# Patient Record
Sex: Female | Born: 1975 | Race: Black or African American | Hispanic: No | Marital: Single | Smoking: Current every day smoker
Health system: Southern US, Community
[De-identification: ages and names within clinical notes are randomized; demographics above are authoritative.]

## PROBLEM LIST (undated history)

## (undated) DIAGNOSIS — J45909 Unspecified asthma, uncomplicated: Secondary | ICD-10-CM

## (undated) DIAGNOSIS — S0500XA Injury of conjunctiva and corneal abrasion without foreign body, unspecified eye, initial encounter: Secondary | ICD-10-CM

## (undated) HISTORY — PX: ABDOMINAL HYSTERECTOMY: SHX81

---

## 2019-12-21 ENCOUNTER — Other Ambulatory Visit: Payer: Self-pay

## 2019-12-21 ENCOUNTER — Emergency Department (HOSPITAL_COMMUNITY)
Admission: EM | Admit: 2019-12-21 | Discharge: 2019-12-22 | Disposition: A | Discharge status: Home / Self Care | Payer: Self-pay | Attending: Emergency Medicine | Admitting: Emergency Medicine

## 2019-12-21 ENCOUNTER — Emergency Department (HOSPITAL_COMMUNITY): Payer: Self-pay

## 2019-12-21 ENCOUNTER — Encounter (HOSPITAL_COMMUNITY): Payer: Self-pay

## 2019-12-21 DIAGNOSIS — S20212A Contusion of left front wall of thorax, initial encounter: Secondary | ICD-10-CM

## 2019-12-21 DIAGNOSIS — Y999 Unspecified external cause status: Secondary | ICD-10-CM | POA: Insufficient documentation

## 2019-12-21 DIAGNOSIS — Y939 Activity, unspecified: Secondary | ICD-10-CM | POA: Insufficient documentation

## 2019-12-21 DIAGNOSIS — S0012XA Contusion of left eyelid and periocular area, initial encounter: Secondary | ICD-10-CM

## 2019-12-21 DIAGNOSIS — Y929 Unspecified place or not applicable: Secondary | ICD-10-CM | POA: Insufficient documentation
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
## 2019-12-22 ENCOUNTER — Emergency Department (HOSPITAL_COMMUNITY): Payer: Self-pay

## 2019-12-22 MED ORDER — IBUPROFEN 800 MG PO TABS: 800.0000 mg | ORAL_TABLET | Freq: Three times a day (TID) | ORAL | 0 refills | Status: AC | PRN

## 2019-12-22 NOTE — Discharge Instructions (Signed)
Ibuprofen 600 mg every 6 hours as needed for pain.  Rest.  Follow-up with primary doctor if not improving in the next few days.

## 2020-05-16 ENCOUNTER — Encounter (HOSPITAL_COMMUNITY): Payer: Self-pay | Admitting: Physician Assistant

## 2020-05-16 ENCOUNTER — Other Ambulatory Visit: Payer: Self-pay

## 2020-05-16 ENCOUNTER — Emergency Department (HOSPITAL_COMMUNITY)
Admission: EM | Admit: 2020-05-16 | Discharge: 2020-05-16 | Disposition: A | Discharge status: Home / Self Care | Payer: Self-pay | Attending: Emergency Medicine | Admitting: Emergency Medicine

## 2020-05-16 DIAGNOSIS — R519 Headache, unspecified: Secondary | ICD-10-CM | POA: Insufficient documentation

## 2020-05-16 DIAGNOSIS — Z20822 Contact with and (suspected) exposure to covid-19: Secondary | ICD-10-CM | POA: Insufficient documentation

## 2020-05-16 DIAGNOSIS — R5383 Other fatigue: Secondary | ICD-10-CM | POA: Insufficient documentation

## 2020-05-16 DIAGNOSIS — M546 Pain in thoracic spine: Secondary | ICD-10-CM | POA: Insufficient documentation

## 2020-05-16 DIAGNOSIS — M791 Myalgia, unspecified site: Secondary | ICD-10-CM | POA: Insufficient documentation

## 2020-05-16 LAB — SARS CORONAVIRUS 2 BY RT PCR (HOSPITAL ORDER, PERFORMED IN ~~LOC~~ HOSPITAL LAB): SARS Coronavirus 2: NEGATIVE

## 2020-05-16 MED ORDER — BENZONATATE 100 MG PO CAPS: 100.0000 mg | ORAL_CAPSULE | Freq: Three times a day (TID) | ORAL | 0 refills | Status: AC
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
## 2020-05-16 NOTE — Discharge Instructions (Addendum)
Rotate tylenol and motrin for fevers and body aches.  Take the cough medication as directed.  Make sure you are pushing fluids and eating and drinking. ° °You should be isolated for at least 7 days since the onset of your symptoms AND >72 hours after symptoms resolution (absence of fever without the use of fever reducing medicaiton and improvement in respiratory symptoms), whichever is longer ° °Please follow up with your primary care provider within 5-7 days for re-evaluation of your symptoms. If you do not have a primary care provider, information for a healthcare clinic has been provided for you to make arrangements for follow up care. Please return to the emergency department for any new or worsening symptoms. ° °

## 2020-05-16 NOTE — ED Triage Notes (Signed)
Patient reports headache and back ache starting last night. Pain rated 3/10 - described as a dull pain

## 2020-06-23 ENCOUNTER — Ambulatory Visit: Payer: Self-pay | Attending: Family | Admitting: Family

## 2020-06-23 ENCOUNTER — Other Ambulatory Visit: Payer: Self-pay

## 2020-06-23 DIAGNOSIS — Z09 Encounter for follow-up examination after completed treatment for conditions other than malignant neoplasm: Secondary | ICD-10-CM

## 2020-06-23 NOTE — Patient Instructions (Signed)
Patient was given clear instructions to go to Emergency Department or return to medical center if symptoms don't improve, worsen, or new problems develop.The patient verbalized understanding. Follow-up in 3 months or sooner if needed to establish care with primary physician.  COVID-19 COVID-19 is a respiratory infection that is caused by a virus called severe acute respiratory syndrome coronavirus 2 (SARS-CoV-2). The disease is also known as coronavirus disease or novel coronavirus. In some people, the virus may not cause any symptoms. In others, it may cause a serious infection. The infection can get worse quickly and can lead to complications, such as:  Pneumonia, or infection of the lungs.  Acute respiratory distress syndrome or ARDS. This is a condition in which fluid build-up in the lungs prevents the lungs from filling with air and passing oxygen into the blood.  Acute respiratory failure. This is a condition in which there is not enough oxygen passing from the lungs to the body or when carbon dioxide is not passing from the lungs out of the body.  Sepsis or septic shock. This is a serious bodily reaction to an infection.  Blood clotting problems.  Secondary infections due to bacteria or fungus.  Organ failure. This is when your body's organs stop working. The virus that causes COVID-19 is contagious. This means that it can spread from person to person through droplets from coughs and sneezes (respiratory secretions). What are the causes? This illness is caused by a virus. You may catch the virus by:  Breathing in droplets from an infected person. Droplets can be spread by a person breathing, speaking, singing, coughing, or sneezing.  Touching something, like a table or a doorknob, that was exposed to the virus (contaminated) and then touching your mouth, nose, or eyes. What increases the risk? Risk for infection You are more likely to be infected with this virus if you:  Are  within 6 feet (2 meters) of a person with COVID-19.  Provide care for or live with a person who is infected with COVID-19.  Spend time in crowded indoor spaces or live in shared housing. Risk for serious illness You are more likely to become seriously ill from the virus if you:  Are 46 years of age or older. The higher your age, the more you are at risk for serious illness.  Live in a nursing home or long-term care facility.  Have cancer.  Have a long-term (chronic) disease such as: ? Chronic lung disease, including chronic obstructive pulmonary disease or asthma. ? A long-term disease that lowers your body's ability to fight infection (immunocompromised). ? Heart disease, including heart failure, a condition in which the arteries that lead to the heart become narrow or blocked (coronary artery disease), a disease which makes the heart muscle thick, weak, or stiff (cardiomyopathy). ? Diabetes. ? Chronic kidney disease. ? Sickle cell disease, a condition in which red blood cells have an abnormal "sickle" shape. ? Liver disease.  Are obese. What are the signs or symptoms? Symptoms of this condition can range from mild to severe. Symptoms may appear any time from 2 to 14 days after being exposed to the virus. They include:  A fever or chills.  A cough.  Difficulty breathing.  Headaches, body aches, or muscle aches.  Runny or stuffy (congested) nose.  A sore throat.  New loss of taste or smell. Some people may also have stomach problems, such as nausea, vomiting, or diarrhea. Other people may not have any symptoms of COVID-19. How is  this diagnosed? This condition may be diagnosed based on:  Your signs and symptoms, especially if: ? You live in an area with a COVID-19 outbreak. ? You recently traveled to or from an area where the virus is common. ? You provide care for or live with a person who was diagnosed with COVID-19. ? You were exposed to a person who was diagnosed  with COVID-19.  A physical exam.  Lab tests, which may include: ? Taking a sample of fluid from the back of your nose and throat (nasopharyngeal fluid), your nose, or your throat using a swab. ? A sample of mucus from your lungs (sputum). ? Blood tests.  Imaging tests, which may include, X-rays, CT scan, or ultrasound. How is this treated? At present, there is no medicine to treat COVID-19. Medicines that treat other diseases are being used on a trial basis to see if they are effective against COVID-19. Your health care provider will talk with you about ways to treat your symptoms. For most people, the infection is mild and can be managed at home with rest, fluids, and over-the-counter medicines. Treatment for a serious infection usually takes places in a hospital intensive care unit (ICU). It may include one or more of the following treatments. These treatments are given until your symptoms improve.  Receiving fluids and medicines through an IV.  Supplemental oxygen. Extra oxygen is given through a tube in the nose, a face mask, or a hood.  Positioning you to lie on your stomach (prone position). This makes it easier for oxygen to get into the lungs.  Continuous positive airway pressure (CPAP) or bi-level positive airway pressure (BPAP) machine. This treatment uses mild air pressure to keep the airways open. A tube that is connected to a motor delivers oxygen to the body.  Ventilator. This treatment moves air into and out of the lungs by using a tube that is placed in your windpipe.  Tracheostomy. This is a procedure to create a hole in the neck so that a breathing tube can be inserted.  Extracorporeal membrane oxygenation (ECMO). This procedure gives the lungs a chance to recover by taking over the functions of the heart and lungs. It supplies oxygen to the body and removes carbon dioxide. Follow these instructions at home: Lifestyle  If you are sick, stay home except to get medical  care. Your health care provider will tell you how long to stay home. Call your health care provider before you go for medical care.  Rest at home as told by your health care provider.  Do not use any products that contain nicotine or tobacco, such as cigarettes, e-cigarettes, and chewing tobacco. If you need help quitting, ask your health care provider.  Return to your normal activities as told by your health care provider. Ask your health care provider what activities are safe for you. General instructions  Take over-the-counter and prescription medicines only as told by your health care provider.  Drink enough fluid to keep your urine pale yellow.  Keep all follow-up visits as told by your health care provider. This is important. How is this prevented?  There is no vaccine to help prevent COVID-19 infection. However, there are steps you can take to protect yourself and others from this virus. To protect yourself:   Do not travel to areas where COVID-19 is a risk. The areas where COVID-19 is reported change often. To identify high-risk areas and travel restrictions, check the CDC travel website: StageSync.si  If you  live in, or must travel to, an area where COVID-19 is a risk, take precautions to avoid infection. ? Stay away from people who are sick. ? Wash your hands often with soap and water for 20 seconds. If soap and water are not available, use an alcohol-based hand sanitizer. ? Avoid touching your mouth, face, eyes, or nose. ? Avoid going out in public, follow guidance from your state and local health authorities. ? If you must go out in public, wear a cloth face covering or face mask. Make sure your mask covers your nose and mouth. ? Avoid crowded indoor spaces. Stay at least 6 feet (2 meters) away from others. ? Disinfect objects and surfaces that are frequently touched every day. This may include:  Counters and tables.  Doorknobs and light switches.  Sinks  and faucets.  Electronics, such as phones, remote controls, keyboards, computers, and tablets. To protect others: If you have symptoms of COVID-19, take steps to prevent the virus from spreading to others.  If you think you have a COVID-19 infection, contact your health care provider right away. Tell your health care team that you think you may have a COVID-19 infection.  Stay home. Leave your house only to seek medical care. Do not use public transport.  Do not travel while you are sick.  Wash your hands often with soap and water for 20 seconds. If soap and water are not available, use alcohol-based hand sanitizer.  Stay away from other members of your household. Let healthy household members care for children and pets, if possible. If you have to care for children or pets, wash your hands often and wear a mask. If possible, stay in your own room, separate from others. Use a different bathroom.  Make sure that all people in your household wash their hands well and often.  Cough or sneeze into a tissue or your sleeve or elbow. Do not cough or sneeze into your hand or into the air.  Wear a cloth face covering or face mask. Make sure your mask covers your nose and mouth. Where to find more information  Centers for Disease Control and Prevention: StickerEmporium.tn  World Health Organization: https://thompson-craig.com/ Contact a health care provider if:  You live in or have traveled to an area where COVID-19 is a risk and you have symptoms of the infection.  You have had contact with someone who has COVID-19 and you have symptoms of the infection. Get help right away if:  You have trouble breathing.  You have pain or pressure in your chest.  You have confusion.  You have bluish lips and fingernails.  You have difficulty waking from sleep.  You have symptoms that get worse. These symptoms may represent a serious problem that is an  emergency. Do not wait to see if the symptoms will go away. Get medical help right away. Call your local emergency services (911 in the [place] Do not drive yourself to the hospital. Let the emergency medical personnel know if you think you have COVID-19. Summary  COVID-19 is a respiratory infection that is caused by a virus. It is also known as coronavirus disease or novel coronavirus. It can cause serious infections, such as pneumonia, acute respiratory distress syndrome, acute respiratory failure, or sepsis.  The virus that causes COVID-19 is contagious. This means that it can spread from person to person through droplets from breathing, speaking, singing, coughing, or sneezing.  You are more likely to develop a serious illness if you are  26 years of age or older, have a weak immune system, live in a nursing home, or have chronic disease.  There is no medicine to treat COVID-19. Your health care provider will talk with you about ways to treat your symptoms.  Take steps to protect yourself and others from infection. Wash your hands often and disinfect objects and surfaces that are frequently touched every day. Stay away from people who are sick and wear a mask if you are sick. This information is not intended to replace advice given to you by your health care provider. Make sure you discuss any questions you have with your health care provider. Document Revised: 07/12/2019 Document Reviewed: 10/18/2018 Elsevier Patient Education  2020 [employer].

## 2020-06-23 NOTE — Progress Notes (Signed)
Virtual Visit via Telephone Note  I connected with Suzanne Bowman, on 06/23/2020 at 3:11 PM by telephone due to the COVID-19 pandemic and verified that I am speaking with the correct person using two identifiers.  Due to current restrictions/limitations of in-office visits due to the COVID-19 pandemic, this scheduled clinical appointment was converted to a telehealth visit.   Consent: I discussed the limitations, risks, security and privacy concerns of performing an evaluation and management service by telephone and the availability of in person appointments. I also discussed with the patient that there may be a patient responsible charge related to this service. The patient expressed understanding and agreed to proceed.  Location of Patient: Home  Location of Provider: [facility]  Persons participating in Telemedicine visit: Suzanne Bowman Ricky Stabs, NP Eloise Levels, RN  History of Present Illness:  Patient ID: Suzanne Bowman, female    DOB: 1976-02-16  MRN: [id number]  CC: Hospital Follow-Up  Subjective: Suzanne Bowman is a 44 y.o. female who presents for hospital follow-up.  1. ER FOLLOW UP: 05/16/2020: Visit with Dr. Denton Lank at the [facility] Emergency Department. Patient presented for evaluation for headache, fatigue, body aches. Lives with husband who is Covid positive. Tested for Covid in the ED. Results are negative but likely a false negative given her husband is positive in the ED. Advised on quarantine measures. Prescription given for symptomatic management. Advised on follow-up and return precautions. Stable for discharge.   06/23/2020: Time since discharge: 37 days Hospital/facility: [facility] Emergency Department Diagnosis: Person under investigation for Covid-19 Procedures/tests: SARS Coronavirus by PCR  Consultants: none New medications: Benzonatate capsule Discharge instructions:  Follow-up and return precautions discussed Status:  better Today patient reports feeling good. Reports she did use Benzonatate capsules and they helped some. Reports she did not use any additional over-the-counter medications. Reports she has already returned to work.  COVID SYMPTOMS HOME MONITORING 06/23/2020:  Cough: Yes []  No [x]  Shortness of breath: Yes []  No [x]  Fever: Yes []  No [x]  Runny nose: Yes []  No [x]  Nasal congestion: Yes []  No [x]  Decreased taste: Yes []  No [x]  Decreased smell: Yes []  No [x]  Decreased appetite: Yes []  No [x]  Trouble swallowing: Yes []  No [x]  Neck swelling/pain: Yes []  No [x]  Sore throat: Yes []  No [x]  Chest pain: Yes []  No [x]  Palpitations: Yes []   No [x]  Chills: Yes []  No [x]  Nausea: Yes []  No [x]  Vomiting: Yes []  No [x]  Abdominal pain: Yes []  No [x]  Body aches: Yes []  No [x]  Headaches: Yes []  No [x]  Dizziness: Yes []  No [x]  Lightheadedness: Yes []  No [x]  Confusion: Yes []  No [x]  Falls: Yes []  No [x]  Decreased urine output: Yes []  No [x]  Decreased sleep quality: Yes []  No [x]  Over-the-counter medications: Yes []  No [x]  Aggravating factors: Yes []  No [x]  Relieving factors: Yes []  No [x]  Limit ADLs: No [x]     No past medical history on file. Allergies  Allergen Reactions  . Penicillins     Current Outpatient Medications on File Prior to Visit  Medication Sig Dispense Refill  . ibuprofen (ADVIL) 800 MG tablet Take 1 tablet (800 mg total) by mouth every 8 (eight) hours as needed. 21 tablet 0   No current facility-administered medications on file prior to visit.    Observations/Objective: Alert and oriented x 3. Not in acute distress. Physical examination not completed as this is a telemedicine visit.  Assessment and Plan: 1. Hospital discharge follow-up: - Visit on 05/16/2020 at the  [facility] Emergency Department. Patient presented for evaluation for headache, fatigue, body aches. Lives with husband who is Covid positive. Tested for Covid in the ED. Results are negative but likely a false  negative given her husband is positive in the ED. Advised on quarantine measures. Prescription given for symptomatic management. Advised on follow-up and return precautions. Stable for discharge.  - Today patient reports feeling good. Reports she did use Benzonatate capsules and they helped some. Reports she did not use any additional over-the-counter medications. Denies having any Covid-related symptoms.  - Patient was given clear instructions to go to Emergency Department or return to medical center if symptoms don't improve, worsen, or new problems develop.The patient verbalized understanding.  Follow Up Instructions: Follow-up in 3 months or sooner if needed to establish care with primary physician.   Patient was given clear instructions to go to Emergency Department or return to medical center if symptoms don't improve, worsen, or new problems develop.The patient verbalized understanding.  I discussed the assessment and treatment plan with the patient. The patient was provided an opportunity to ask questions and all were answered. The patient agreed with the plan and demonstrated an understanding of the instructions.   The patient was advised to call back or seek an in-person evaluation if the symptoms worsen or if the condition fails to improve as anticipated.   I provided 6 minutes total of non-face-to-face time during this encounter including median intraservice time, reviewing previous notes, labs, imaging, medications, management and patient verbalized understanding.    Rema Fendt, NP  [facility] [place], [place] [phone]   06/23/2020, 4:15 PM

## 2020-12-24 ENCOUNTER — Other Ambulatory Visit: Payer: Self-pay

## 2020-12-24 ENCOUNTER — Emergency Department (HOSPITAL_COMMUNITY)
Admission: EM | Admit: 2020-12-24 | Discharge: 2020-12-24 | Disposition: A | Discharge status: Home / Self Care | Payer: Self-pay | Attending: Emergency Medicine | Admitting: Emergency Medicine

## 2020-12-24 ENCOUNTER — Encounter (HOSPITAL_COMMUNITY): Payer: Self-pay

## 2020-12-24 DIAGNOSIS — X58XXXA Exposure to other specified factors, initial encounter: Secondary | ICD-10-CM | POA: Insufficient documentation

## 2020-12-24 DIAGNOSIS — J45909 Unspecified asthma, uncomplicated: Secondary | ICD-10-CM | POA: Insufficient documentation

## 2020-12-24 DIAGNOSIS — S0502XA Injury of conjunctiva and corneal abrasion without foreign body, left eye, initial encounter: Secondary | ICD-10-CM | POA: Insufficient documentation

## 2020-12-24 DIAGNOSIS — F17210 Nicotine dependence, cigarettes, uncomplicated: Secondary | ICD-10-CM | POA: Insufficient documentation

## 2020-12-24 HISTORY — DX: Injury of conjunctiva and corneal abrasion without foreign body, unspecified eye, initial encounter: S05.00XA

## 2020-12-24 HISTORY — DX: Unspecified asthma, uncomplicated: J45.909

## 2020-12-24 MED ORDER — FLUORESCEIN SODIUM 1 MG OP STRP
1.0000 | ORAL_STRIP | Freq: Once | OPHTHALMIC | Status: AC
  Administered 2020-12-24: 1 via OPHTHALMIC
  Filled 2020-12-24: qty 1

## 2020-12-24 MED ORDER — ERYTHROMYCIN 5 MG/GM OP OINT: TOPICAL_OINTMENT | OPHTHALMIC | 0 refills | Status: DC

## 2020-12-24 MED ORDER — TETRACAINE HCL 0.5 % OP SOLN
2.0000 [drp] | Freq: Once | OPHTHALMIC | Status: AC
  Administered 2020-12-24: 2 [drp] via OPHTHALMIC
  Filled 2020-12-24: qty 4
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
## 2020-12-24 NOTE — Discharge Instructions (Addendum)
You came to the emergency department today to be evaluated for your left eye pain.  Your physical exam was consistent with a corneal abrasion.  Your started on topical antibiotics.  Please use this medication as prescribed.  Please follow-up with Dr. Randon Goldsmith with Ophthalmology.    Get help right away if: You have severe eye pain that does not get better with medicine. You have vision loss.

## 2021-05-29 ENCOUNTER — Emergency Department (HOSPITAL_COMMUNITY)
Admission: EM | Admit: 2021-05-29 | Discharge: 2021-05-29 | Disposition: A | Discharge status: Home / Self Care | Payer: Self-pay | Attending: Emergency Medicine | Admitting: Emergency Medicine

## 2021-05-29 ENCOUNTER — Other Ambulatory Visit: Payer: Self-pay

## 2021-05-29 ENCOUNTER — Encounter (HOSPITAL_COMMUNITY): Payer: Self-pay | Admitting: Emergency Medicine

## 2021-05-29 DIAGNOSIS — S1091XA Abrasion of unspecified part of neck, initial encounter: Secondary | ICD-10-CM | POA: Insufficient documentation

## 2021-05-29 DIAGNOSIS — S2020XA Contusion of thorax, unspecified, initial encounter: Secondary | ICD-10-CM | POA: Insufficient documentation

## 2021-05-29 DIAGNOSIS — S40022A Contusion of left upper arm, initial encounter: Secondary | ICD-10-CM | POA: Insufficient documentation

## 2021-05-29 NOTE — ED Notes (Signed)
Pt decided to leave emergency department  °
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
## 2022-12-12 ENCOUNTER — Telehealth: Payer: Self-pay

## 2022-12-12 NOTE — Telephone Encounter (Signed)
Mychart msg sent

## 2023-03-25 ENCOUNTER — Telehealth: Payer: 59 | Admitting: Emergency Medicine

## 2023-03-25 DIAGNOSIS — H02846 Edema of left eye, unspecified eyelid: Secondary | ICD-10-CM

## 2023-03-25 DIAGNOSIS — M7989 Other specified soft tissue disorders: Secondary | ICD-10-CM | POA: Diagnosis not present

## 2023-03-25 MED ORDER — ERYTHROMYCIN 5 MG/GM OP OINT: 1.0000 | TOPICAL_OINTMENT | Freq: Every day | OPHTHALMIC | 0 refills | Status: DC

## 2023-03-25 NOTE — Patient Instructions (Signed)
Toney Reil, thank you for joining [name], PA-C for today's virtual visit.  While this provider is not your primary care provider (PCP), if your PCP is located in our provider database this encounter information will be shared with them immediately following your visit.   A [facility] MyChart account gives you access to today's visit and all your visits, tests, and labs performed at [facility] " click here if you don't have a [facility] MyChart account or go to [email or web address]  Consent: (Patient) Suzanne Bowman provided verbal consent for this virtual visit at the beginning of the encounter.  Current Medications:  Current Outpatient Medications:    erythromycin ophthalmic ointment, Place 1 Application into the left eye at bedtime., Disp: 3.5 g, Rfl: 0   ibuprofen (ADVIL) 800 MG tablet, Take 1 tablet (800 mg total) by mouth every 8 (eight) hours as needed., Disp: 21 tablet, Rfl: 0   Medications ordered in this encounter:  Meds ordered this encounter  Medications   erythromycin ophthalmic ointment    Sig: Place 1 Application into the left eye at bedtime.    Dispense:  3.5 g    Refill:  0    Order Specific Question:   Supervising Provider    Answer:   Merrilee Jansky [[id number]]     *If you need refills on other medications prior to your next appointment, please contact your pharmacy*  Follow-Up: Call back or seek an in-person evaluation if the symptoms worsen or if the condition fails to improve as anticipated.  [facility] [phone]  Other Instructions Eyelid swelling: Continue warm compresses at home.  Soak a wash cloth in warm (not scalding) water and place it over the eyes. As the wash cloth cools, it should be rewarmed and replaced for a total of 5 to 10 minutes of soaking time. Warm compresses should be applied two to four times a day as long as the patient has symptoms Perform lid washing: Either warm water or very dilute baby  shampoo can be placed on a clean wash cloth, gauze pad, or cotton swab. Then be advised to gently clean along the lashes and lid margin to remove the accumulated material with care to avoid contacting the ocular surface. If shampoo is used, thorough rinsing is recommended. Vigorous washing should be avoided, as it may cause more irritation.  Prescribed erythromycin ointment.  Apply up to 6 times daily for 5-7 days, or until symptomatic improvement Follow up in person at urgent care or go to ER if you have any new or worsening symptoms such as fever, chills, redness, swelling, eye pain, painful eye movements, vision changes, etc...  Bilateral leg swelling: Elevate legs, wear compression stocking, limit salty/ sugar drinks/ foods Follow up with PCP for further evaluation and management Go to the ED if you have concern for blood clot, or noticed wound, redness, drainage, chest pain, limb discoloration, temperature changes, and/or shortness of breath, etc...   If you have been instructed to have an in-person evaluation today at a local Urgent Care facility, please use the link below. It will take you to a list of all of our available [facility], including address, phone number and hours of operation. Please do not delay care.  [facility]  If you or a family member do not have a primary care provider, use the link below to schedule a visit and establish care. When you choose a [facility] primary care physician or advanced  practice provider, you gain a long-term partner in health. Find a Primary Care Provider  Learn more about [facility]'s in-office and virtual care options: [facility]

## 2023-03-25 NOTE — Progress Notes (Signed)
Virtual Visit Consent   Suzanne Bowman, you are scheduled for a virtual visit with a [facility] provider today. Just as with appointments in the office, your consent must be obtained to participate. Your consent will be active for this visit and any virtual visit you may have with one of our providers in the next 365 days. If you have a MyChart account, a copy of this consent can be sent to you electronically.  As this is a virtual visit, video technology does not allow for your provider to perform a traditional examination. This may limit your provider's ability to fully assess your condition. If your provider identifies any concerns that need to be evaluated in person or the need to arrange testing (such as labs, EKG, etc.), we will make arrangements to do so. Although advances in technology are sophisticated, we cannot ensure that it will always work on either your end or our end. If the connection with a video visit is poor, the visit may have to be switched to a telephone visit. With either a video or telephone visit, we are not always able to ensure that we have a secure connection.  By engaging in this virtual visit, you consent to the provision of healthcare and authorize for your insurance to be billed (if applicable) for the services provided during this visit. Depending on your insurance coverage, you may receive a charge related to this service.  I need to obtain your verbal consent now. Are you willing to proceed with your visit today? Suzanne Bowman has provided verbal consent on 03/25/2023 for a virtual visit (video or telephone). Suzanne Bowman, [place]  Date: 03/25/2023 2:04 PM  Virtual Visit via Video Note   I, Suzanne Bowman, connected with  Silver Willems  ([id number], Oct 15, 1975) on 03/25/23 at 12:00 PM EDT by a video-enabled telemedicine application and verified that I am speaking with the correct person using two identifiers.  Location: Patient: Virtual Visit Location Patient:  Home Provider: Virtual Visit Location Provider: Home Office   I discussed the limitations of evaluation and management by telemedicine and the availability of in person appointments. The patient expressed understanding and agreed to proceed.    History of Present Illness:  SUBJECTIVE:  Suzanne Bowman is a 47 y.o. female who presents with complaint of LT eyelid discomfort and swelling x 3 days.  Denies a precipitating event, trauma, or close contacts with similar symptoms.  Has not shared make-up or used expired make-up.  Has tried OTC clear eyes and cold compress without relief.  Symptoms are made worse at night.  Denies similar symptoms in the past.  Denies fever, chills, nausea, vomiting, eye pain, painful eye movements, halos, discharge, itching, vision changes, double vision, FB sensation, periorbital erythema.     Admits contact lens use.    Patient also mentions bilateral ankle swelling x 4-5 months.  Denies a precipitating event, trauma or injury.  Denies wounds, unilateral calf pain/ swelling, or hx of CHF, DM, vascular disease.  Has noted symptoms are most pronounced after eating salty/ sugary foods.  Worse in morning and with prolonged walking.   Has tried elevating legs without much improvement.  Denies CP, SOB, cough, erythema, wounds, calf pain, numbness/ tingling.    Denies SOB, calf pain or swelling, recent long travel, recent surgery, hormone use, tobacco use, malignancy, pregnancy, hx of blood clot.     ROS: As per HPI.  All other pertinent ROS negative.   Past Medical History:  Diagnosis Date   Asthma  Scratched cornea       Allergies:  Allergies  Allergen Reactions   Penicillins    Medications:  Current Outpatient Medications:    erythromycin ophthalmic ointment, Place 1 Application into the left eye at bedtime., Disp: 3.5 g, Rfl: 0   ibuprofen (ADVIL) 800 MG tablet, Take 1 tablet (800 mg total) by mouth every 8 (eight) hours as needed., Disp: 21 tablet, Rfl:  0  Observations/Objective: Patient is well-developed, well-nourished in no acute distress.  Resting comfortably at home.  Head is normocephalic, atraumatic. LT eyelid appears to be mildly swollen with some erythema mostly localized to LT upper eye along the medial aspect; no obvious stye present; conjunctiva appear clear without erythema, no tear or purulent drainage is appreciated on video No labored breathing. Speaking in full sentences Speech is clear and coherent with logical content.  Patient is alert and oriented at baseline.   MDM:  47 year old female presenting with chronic bilateral LE swelling x 4-5 months.  HPI and PE detailed above.  Vital signs not available.  DDX: DVT, cellulitis, PVD, pedal edema, new onset CHF, medication SE.  Based on HPI with no documented hx of CHF, PVD, medications that may cause edema, will treat for pedal edema. Swelling is chronic in nature which makes DVT, and cellulitis unlikely.  Patient also states symptoms are worse with prolonged standing/ walking, as well as salty diet.  Patient also denies DVT RF.  Conservative measures advised.  Strict ED precautions given.   Assessment and Plan: 1. Swelling of left eyelid  2. Leg swelling    Eyelid swelling: Continue warm compresses at home.  Soak a wash cloth in warm (not scalding) water and place it over the eyes. As the wash cloth cools, it should be rewarmed and replaced for a total of 5 to 10 minutes of soaking time. Warm compresses should be applied two to four times a day as long as the patient has symptoms Perform lid washing: Either warm water or very dilute baby shampoo can be placed on a clean wash cloth, gauze pad, or cotton swab. Then be advised to gently clean along the lashes and lid margin to remove the accumulated material with care to avoid contacting the ocular surface. If shampoo is used, thorough rinsing is recommended. Vigorous washing should be avoided, as it may cause more irritation.   Prescribed erythromycin ointment.  Apply up to 6 times daily for 5-7 days, or until symptomatic improvement Follow up in person at urgent care or go to ER if you have any new or worsening symptoms such as fever, chills, redness, swelling, eye pain, painful eye movements, vision changes, etc...  Bilateral leg swelling: Elevate legs, wear compression stocking, limit salty/ sugar drinks/ foods Follow up with PCP for further evaluation and management Go to the ED if you have concern for blood clot, or noticed wound, redness, drainage, chest pain, limb discoloration, temperature changes, and/or shortness of breath, etc...  Follow Up Instructions: I discussed the assessment and treatment plan with the patient. The patient was provided an opportunity to ask questions and all were answered. The patient agreed with the plan and demonstrated an understanding of the instructions.  A copy of instructions were sent to the patient via MyChart unless otherwise noted below.   The patient was advised to call back or seek an in-person evaluation if the symptoms worsen or if the condition fails to improve as anticipated.  Time:  I spent 20-30 minutes with the patient via  telehealth technology discussing the above problems/concerns.    Suzanne Harding, PA-C

## 2023-03-25 NOTE — Addendum Note (Signed)
Addended by: Myrla Halsted on: 03/25/2023 04:22 PM   Modules accepted: Orders

## 2023-06-26 ENCOUNTER — Ambulatory Visit: Payer: 59 | Admitting: Family Medicine

## 2023-06-27 ENCOUNTER — Ambulatory Visit: Payer: 59 | Admitting: Family Medicine

## 2023-07-06 ENCOUNTER — Emergency Department (HOSPITAL_COMMUNITY)
Admission: EM | Admit: 2023-07-06 | Discharge: 2023-07-06 | Disposition: A | Discharge status: Home / Self Care | Payer: 59 | Attending: Emergency Medicine | Admitting: Emergency Medicine

## 2023-07-06 ENCOUNTER — Emergency Department (HOSPITAL_COMMUNITY): Payer: 59

## 2023-07-06 ENCOUNTER — Other Ambulatory Visit: Payer: Self-pay

## 2023-07-06 DIAGNOSIS — S161XXA Strain of muscle, fascia and tendon at neck level, initial encounter: Secondary | ICD-10-CM | POA: Insufficient documentation

## 2023-07-06 DIAGNOSIS — R202 Paresthesia of skin: Secondary | ICD-10-CM | POA: Diagnosis not present

## 2023-07-06 DIAGNOSIS — Y92410 Unspecified street and highway as the place of occurrence of the external cause: Secondary | ICD-10-CM | POA: Insufficient documentation

## 2023-07-06 DIAGNOSIS — J439 Emphysema, unspecified: Secondary | ICD-10-CM | POA: Diagnosis not present

## 2023-07-06 DIAGNOSIS — S0990XA Unspecified injury of head, initial encounter: Secondary | ICD-10-CM | POA: Diagnosis not present

## 2023-07-06 DIAGNOSIS — S199XXA Unspecified injury of neck, initial encounter: Secondary | ICD-10-CM | POA: Diagnosis not present

## 2023-07-06 MED ORDER — LIDOCAINE 5 % EX PTCH
2.0000 | MEDICATED_PATCH | CUTANEOUS | Status: DC
  Administered 2023-07-06: 2 via TRANSDERMAL
  Filled 2023-07-06: qty 2

## 2023-07-06 MED ORDER — ACETAMINOPHEN 500 MG PO TABS
1000.0000 mg | ORAL_TABLET | Freq: Once | ORAL | Status: AC
  Administered 2023-07-06: 1000 mg via ORAL
  Filled 2023-07-06: qty 2

## 2023-07-06 MED ORDER — CYCLOBENZAPRINE HCL 10 MG PO TABS: 5.0000 mg | ORAL_TABLET | Freq: Every day | ORAL | 0 refills | Status: AC
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
## 2023-07-06 NOTE — Discharge Instructions (Addendum)
Your CT shows no signs of brain bleed, no signs of fracture or dislocation of your spine.  No signs of spinal cord impingement.  I suspect your neck pain is due to muscle stiffness.  You must take off your lidocaine patches by 9:30 AM tomorrow morning.  Do not leave on longer than 12 hours  For pain you may take 600mg  ibuprofen, then 3 hours later take 1000mg  tylenol, then 3 hours later 600mg  ibuprofen, then 3 hours later 1000mg  tylenol, so on and so forth for pain control. Do not do this for longer than 3 days.   You have been prescribed a muscle relaxer called Flexeril (cyclobenzaprine). You may take 1 tablet (5mg ) before bed as needed for muscle pain. This medication can be sedating. Do not drive or operate heavy machinery after taking this medicine. Do not drink alcohol or take other sedating medications when taking this medicine for safety reasons.  Keep this out of reach of small children.    You may use heating packs on your neck to help with muscle soreness and stiffness.  Please follow-up with your PCP if symptoms do not start to improve within the next week as you may benefit from physical therapy referral.  Return to the ER for any weakness of your hands, complete numbness of your hands, severe headache, any other new or concerning symptoms.

## 2023-08-12 ENCOUNTER — Telehealth: Payer: 59 | Admitting: Family Medicine

## 2023-08-12 DIAGNOSIS — H109 Unspecified conjunctivitis: Secondary | ICD-10-CM | POA: Diagnosis not present

## 2023-08-12 MED ORDER — ERYTHROMYCIN 5 MG/GM OP OINT: 1.0000 | TOPICAL_OINTMENT | Freq: Four times a day (QID) | OPHTHALMIC | 0 refills | Status: AC

## 2023-08-12 NOTE — Progress Notes (Signed)
Virtual Visit Consent   Suzanne Bowman, you are scheduled for a virtual visit with a [facility] provider today. Just as with appointments in the office, your consent must be obtained to participate. Your consent will be active for this visit and any virtual visit you may have with one of our providers in the next 365 days. If you have a MyChart account, a copy of this consent can be sent to you electronically.  As this is a virtual visit, video technology does not allow for your provider to perform a traditional examination. This may limit your provider's ability to fully assess your condition. If your provider identifies any concerns that need to be evaluated in person or the need to arrange testing (such as labs, EKG, etc.), we will make arrangements to do so. Although advances in technology are sophisticated, we cannot ensure that it will always work on either your end or our end. If the connection with a video visit is poor, the visit may have to be switched to a telephone visit. With either a video or telephone visit, we are not always able to ensure that we have a secure connection.  By engaging in this virtual visit, you consent to the provision of healthcare and authorize for your insurance to be billed (if applicable) for the services provided during this visit. Depending on your insurance coverage, you may receive a charge related to this service.  I need to obtain your verbal consent now. Are you willing to proceed with your visit today? Suzanne Bowman has provided verbal consent on 08/12/2023 for a virtual visit (video or telephone). Reed Pandy, [place]  Date: 08/12/2023 10:44 AM  Virtual Visit via Video Note   I, Reed Pandy, connected with  Suzanne Bowman  ([id number], September 11, 1976) on 08/12/23 at 10:45 AM EST by a video-enabled telemedicine application and verified that I am speaking with the correct person using two identifiers.  Location: Patient: Virtual Visit Location Patient:  Home Provider: Virtual Visit Location Provider: Home Office   I discussed the limitations of evaluation and management by telemedicine and the availability of in person appointments. The patient expressed understanding and agreed to proceed.    History of Present Illness: Suzanne Bowman is a 47 y.o. who identifies as a female who was assigned female at birth, and is being seen today for c/o Pt states woke up with swollen right eye.  Pt denies pain but reports itching. Pt states she has green looking pus in the corner of her eye. Pt states just her right eye and no one else at home has similar symptoms.  Pt denies fever or chills. Pt denies vision changes.   HPI: HPI  Problems: There are no problems to display for this patient.   Allergies:  Allergies  Allergen Reactions   Penicillins    Medications:  Current Outpatient Medications:    erythromycin ophthalmic ointment, Place 1 Application into the right eye in the morning, at noon, in the evening, and at bedtime for 7 days., Disp: 28 g, Rfl: 0   ibuprofen (ADVIL) 800 MG tablet, Take 1 tablet (800 mg total) by mouth every 8 (eight) hours as needed., Disp: 21 tablet, Rfl: 0  Observations/Objective: Patient is well-developed, well-nourished in no acute distress.  Resting comfortably at home.  Head is normocephalic, atraumatic.  Right eye noted with upper and lower lid edema and slight discharge from eye.  No labored breathing.  Speech is clear and coherent with logical content.  Patient is alert and  oriented at baseline.    Assessment and Plan: 1. Bacterial conjunctivitis of right eye - erythromycin ophthalmic ointment; Place 1 Application into the right eye in the morning, at noon, in the evening, and at bedtime for 7 days.  Dispense: 28 g; Refill: 0  -Start Erythromycin ointment -Pt to F/U with PCP or urgent care for worsening symptoms  Follow Up Instructions: I discussed the assessment and treatment plan with the patient. The  patient was provided an opportunity to ask questions and all were answered. The patient agreed with the plan and demonstrated an understanding of the instructions.  A copy of instructions were sent to the patient via MyChart unless otherwise noted below.     The patient was advised to call back or seek an in-person evaluation if the symptoms worsen or if the condition fails to improve as anticipated.    Reed Pandy, PA-C

## 2023-08-12 NOTE — Patient Instructions (Signed)
Toney Reil, thank you for joining Reed Pandy, PA-C for today's virtual visit.  While this provider is not your primary care provider (PCP), if your PCP is located in our provider database this encounter information will be shared with them immediately following your visit.   A [facility] MyChart account gives you access to today's visit and all your visits, tests, and labs performed at [facility] " click here if you don't have a [facility] MyChart account or go to [email or web address]  Consent: (Patient) Suzanne Bowman provided verbal consent for this virtual visit at the beginning of the encounter.  Current Medications:  Current Outpatient Medications:    erythromycin ophthalmic ointment, Place 1 Application into the right eye in the morning, at noon, in the evening, and at bedtime for 7 days., Disp: 28 g, Rfl: 0   ibuprofen (ADVIL) 800 MG tablet, Take 1 tablet (800 mg total) by mouth every 8 (eight) hours as needed., Disp: 21 tablet, Rfl: 0   Medications ordered in this encounter:  Meds ordered this encounter  Medications   erythromycin ophthalmic ointment    Sig: Place 1 Application into the right eye in the morning, at noon, in the evening, and at bedtime for 7 days.    Dispense:  28 g    Refill:  0     *If you need refills on other medications prior to your next appointment, please contact your pharmacy*  Follow-Up: Call back or seek an in-person evaluation if the symptoms worsen or if the condition fails to improve as anticipated.   Virtual Care [phone]  Other Instructions Bacterial Conjunctivitis, Adult Bacterial conjunctivitis is an infection of the clear membrane that covers the white part of the eye and the inner surface of the eyelid (conjunctiva). When the blood vessels in the conjunctiva become inflamed, the eye becomes red or pink. The eye often feels irritated or itchy. Bacterial conjunctivitis spreads easily from person to  person (is contagious). It also spreads easily from one eye to the other eye. What are the causes? This condition is caused by bacteria. You may get the infection if you come into close contact with: A person who is infected with the bacteria. Items that are contaminated with the bacteria, such as a face towel, contact lens solution, or eye makeup. What increases the risk? You are more likely to develop this condition if: You are exposed to other people who have the infection. You wear contact lenses. You have a sinus infection. You have had a recent eye injury or surgery. You have a weak body defense system (immune system). You have a medical condition that causes dry eyes. What are the signs or symptoms? Symptoms of this condition include: Thick, yellowish discharge from the eye. This may turn into a crust on the eyelid overnight and cause your eyelids to stick together. Tearing or watery eyes. Itchy eyes. Burning feeling in your eyes. Eye redness. Swollen eyelids. Blurred vision. How is this diagnosed? This condition is diagnosed based on your symptoms and medical history. Your health care provider may also take a sample of discharge from your eye to find the cause of your infection. How is this treated? This condition may be treated with: Antibiotic eye drops or ointment to clear the infection more quickly and prevent the spread of infection to others. Antibiotic medicines taken by mouth (orally) to treat infections that do not respond to drops or ointments or that last longer than 10 days. Cool, wet cloths (cool  compresses) placed on the eyes. Artificial tears applied 2-6 times a day. Follow these instructions at home: Medicines Take or apply your antibiotic medicine as told by your health care provider. Do not stop using the antibiotic, even if your condition improves, unless directed by your health care provider. Take or apply over-the-counter and prescription medicines only  as told by your health care provider. Be very careful to avoid touching the edge of your eyelid with the eye-drop bottle or the ointment tube when you apply medicines to the affected eye. This will keep you from spreading the infection to your other eye or to other people. Managing discomfort Gently wipe away any drainage from your eye with a warm, wet washcloth or a cotton ball. Apply a clean, cool compress to your eye for 10-20 minutes, 3-4 times a day. General instructions Do not wear contact lenses until the inflammation is gone and your health care provider says it is safe to wear them again. Ask your health care provider how to sterilize or replace your contact lenses before you use them again. Wear glasses until you can resume wearing contact lenses. Avoid wearing eye makeup until the inflammation is gone. Throw away any old eye cosmetics that may be contaminated. Change or wash your pillowcase every day. Do not share towels or washcloths. This may spread the infection. Wash your hands often with soap and water for at least 20 seconds and especially before touching your face or eyes. Use paper towels to dry your hands. Avoid touching or rubbing your eyes. Do not drive or use heavy machinery if your vision is blurred. Contact a health care provider if: You have a fever. Your symptoms do not get better after 10 days. Get help right away if: You have a fever and your symptoms suddenly get worse. You have severe pain when you move your eye. You have facial pain, redness, or swelling. You have a sudden loss of vision. Summary Bacterial conjunctivitis is an infection of the clear membrane that covers the white part of the eye and the inner surface of the eyelid (conjunctiva). Bacterial conjunctivitis spreads easily from eye to eye and from person to person (is contagious). Wash your hands often with soap and water for at least 20 seconds and especially before touching your face or eyes. Use  paper towels to dry your hands. Take or apply your antibiotic medicine as told by your health care provider. Do not stop using the antibiotic even if your condition improves. Contact a health care provider if you have a fever or if your symptoms do not get better after 10 days. Get help right away if you have a sudden loss of vision. This information is not intended to replace advice given to you by your health care provider. Make sure you discuss any questions you have with your health care provider. Document Revised: 12/23/2020 Document Reviewed: 12/23/2020 Elsevier Patient Education  2024 [employer]    If you have been instructed to have an in-person evaluation today at a local Urgent Care facility, please use the link below. It will take you to a list of all of our available [facility], including address, phone number and hours of operation. Please do not delay care.  [facility]  If you or a family member do not have a primary care provider, use the link below to schedule a visit and establish care. When you choose a [facility] primary care physician or advanced practice provider,  you gain a long-term partner in health. Find a Primary Care Provider  Learn more about [facility]'s in-office and virtual care options: [facility] - Get Care Now

## 2024-01-28 ENCOUNTER — Emergency Department (HOSPITAL_COMMUNITY)

## 2024-01-28 ENCOUNTER — Other Ambulatory Visit: Payer: Self-pay

## 2024-01-28 ENCOUNTER — Emergency Department (HOSPITAL_COMMUNITY)
Admission: EM | Admit: 2024-01-28 | Discharge: 2024-01-28 | Disposition: A | Discharge status: Home / Self Care | Attending: Emergency Medicine | Admitting: Emergency Medicine

## 2024-01-28 ENCOUNTER — Encounter (HOSPITAL_COMMUNITY): Payer: Self-pay

## 2024-01-28 DIAGNOSIS — M25562 Pain in left knee: Secondary | ICD-10-CM | POA: Diagnosis not present

## 2024-01-28 DIAGNOSIS — M25561 Pain in right knee: Secondary | ICD-10-CM | POA: Insufficient documentation

## 2024-01-28 MED ORDER — OXYCODONE-ACETAMINOPHEN 5-325 MG PO TABS: 1.0000 | ORAL_TABLET | Freq: Four times a day (QID) | ORAL | 0 refills | Status: AC | PRN

## 2024-01-28 MED ORDER — IBUPROFEN 800 MG PO TABS
800.0000 mg | ORAL_TABLET | Freq: Once | ORAL | Status: AC
  Administered 2024-01-28: 800 mg via ORAL
  Filled 2024-01-28: qty 1

## 2024-01-28 NOTE — ED Triage Notes (Signed)
 Pt states she was in the bathroom and thinks she twisted her left knee. Pt c.o severe pain to her left knee, no swelling noted.

## 2024-01-28 NOTE — ED Provider Notes (Signed)
 [place] EMERGENCY DEPARTMENT AT [facility] Provider Note   CSN: [id number] Arrival date & time: 01/28/24  1614     History Chief Complaint  Patient presents with   Knee Pain    Suzanne Bowman is a 48 y.o. female.  Patient without significant medical history presents emergency department today with concerns of knee pain.  She reports that she was in the bathroom and please that she twisted her left knee with sudden onset of pain in the joint space of the knee.  She states that she is having some difficulty weightbearing.  Denies any falls or trauma to the area recently.  No history of any knee surgeries or history of knee problems as far she is aware.  No medications taken prior to writing.  Patient reports pain is alleviated by maintaining the in a slightly flexed position.  States that pain is worsened with full extension of the knee.   Knee Pain      Home Medications Prior to Admission medications   Medication Sig Start Date End Date Taking? Authorizing Provider  oxyCODONE-acetaminophen  (PERCOCET/ROXICET) 5-325 MG tablet Take 1 tablet by mouth every 6 (six) hours as needed for severe pain (pain score 7-10). 01/28/24  Yes Aashish Hamm A, PA-C  ibuprofen  (ADVIL ) 800 MG tablet Take 1 tablet (800 mg total) by mouth every 8 (eight) hours as needed. 12/22/19   Orvilla Blander, MD      Allergies    Penicillins    Review of Systems   Review of Systems  Musculoskeletal:        Knee pain  All other systems reviewed and are negative.   Physical Exam Updated Vital Signs BP (!) 142/99 (BP Location: Right Arm)   Pulse 91   Temp 98.2 F (36.8 C)   Resp 18   SpO2 97%  Physical Exam Vitals and nursing note reviewed.  Constitutional:      General: She is not in acute distress.    Appearance: She is well-developed.  HENT:     Head: Normocephalic and atraumatic.  Eyes:     Conjunctiva/sclera: Conjunctivae normal.  Cardiovascular:     Rate and Rhythm: Normal rate and  regular rhythm.     Heart sounds: No murmur heard. Pulmonary:     Effort: Pulmonary effort is normal. No respiratory distress.     Breath sounds: Normal breath sounds.  Abdominal:     Palpations: Abdomen is soft.     Tenderness: There is no abdominal tenderness.  Musculoskeletal:        General: Swelling, tenderness and signs of injury present. No deformity.     Cervical back: Neck supple.     Comments: Range of motion testing of the left knee is difficult to assess due to pain.  There is some midline tenderness and joint swelling of the left knee.  Ligamental testing unable to be performed due to patient pain.  Skin:    General: Skin is warm and dry.     Capillary Refill: Capillary refill takes less than 2 seconds.  Neurological:     Mental Status: She is alert.  Psychiatric:        Mood and Affect: Mood normal.     ED Results / Procedures / Treatments   Labs (all labs ordered are listed, but only abnormal results are displayed) Labs Reviewed - No data to display  EKG None  Radiology DG Knee Complete 4 Views Left Result Date: 01/28/2024 CLINICAL DATA:  Fall, knee pain  EXAM: LEFT KNEE - COMPLETE 4+ VIEW COMPARISON:  None Available. FINDINGS: Question small avulsed fragment off the inferior pole of the patella. Mild soft tissue swelling in the infrapatellar lesion. No additional fracture, subluxation or dislocation. No joint effusion. IMPRESSION: Questionable small avulsed fragment off the inferior patella. Electronically Signed   By: Janeece Mechanic M.D.   On: 01/28/2024 18:12    Procedures Procedures    Medications Ordered in ED Medications  ibuprofen  (ADVIL ) tablet 800 mg (800 mg Oral Given 01/28/24 1741)    ED Course/ Medical Decision Making/ A&P Clinical Course as of 01/28/24 1857  Sun Jan 28, 2024  1820 DG Knee Complete 4 Views Left [OZ]  1855 DG Knee Complete 4 Views Left [OZ]    Clinical Course User Index [OZ] Concetta Dee, PA-C                                  Medical Decision Making Amount and/or Complexity of Data Reviewed Radiology: ordered. Decision-making details documented in ED Course.  Risk Prescription drug management.   This patient presents to the ED for concern of knee pain.  Differential diagnosis includes osteoarthritis, meniscal injury, tibial plateau fracture, ligamental strain, patellar dislocation   Imaging Studies ordered:  I ordered imaging studies including x-ray of the left knee I independently visualized and interpreted imaging which showed questionable small avulsed fragment off the inferior patella. I agree with the radiologist interpretation   Medicines ordered and prescription drug management:  I ordered medication including ibuprofen  for pain Reevaluation of the patient after these medicines showed that the patient improved I have reviewed the patients home medicines and have made adjustments as needed   Problem List / ED Course:  Patient without significant medical history presents the emergency department today with concerns of knee pain.  She reports that she twisted her knee earlier today while she was in the bathroom and states that pain began soon after that.  She denies falling or impacting the knee on any surface.  Denies any recent injury to the knee.  Does report some difficulty with weightbearing due to pain.  Denies any loss of sensation or motor function of the lower extremity but does endorse some difficulty movement due to pain.  States that she is having hard time weightbearing. Physical exam reveals joint line tenderness of the left knee with some swelling present over this area.  Higher concern for possible omental injury or meniscal tear given the swelling that is present with a twisting motion.  Doubtful of fracture given atraumatic course although will obtain x-ray imaging for assessment.  Ibuprofen  ordered for pain control.  Will reassess shortly. X-ray imaging is concerning for a  questionable small avulsed fragment off the inferior patella.  This is the area of pain that patient is currently endorsing.  I am concerned for possible patellar tendon injury given the mechanism with shearing of the inferior pole of the patella.  Will place patient into a knee immobilizer and provided with crutches and instructions for close outpatient follow-up with orthopedics.  No acute or emergent indication for MRI imaging of this injury at this time.Discharged home with a short supply of Percocet for pain control. Advised OTC pain medications such as Tylenol  or Advil  in the meantime for mild to moderate pain. Patient agreeable with plan and verbalized understanding all return precautions.  Final Clinical Impression(s) / ED Diagnoses Final diagnoses:  Acute pain of  right knee    Rx / DC Orders ED Discharge Orders          Ordered    oxyCODONE-acetaminophen  (PERCOCET/ROXICET) 5-325 MG tablet  Every 6 hours PRN        01/28/24 1830              Keyonia Gluth A, PA-C 01/28/24 1857    Tonya Fredrickson, MD 01/29/24 1036

## 2024-01-28 NOTE — Discharge Instructions (Addendum)
 You were seen in the ER today for concerns of right knee pain. Your xray was concerning for a possible small avulsion fracture on the bottom portion of your patella or knee cap. This may have been caused by your reported twisting movement before the pain began. I would recommend following up with orthopedics for further evaluation of your pain and this injury. For any concerns of new or worsening symptom, return to the ER.

## 2024-09-20 ENCOUNTER — Telehealth: Payer: Self-pay | Admitting: Family Medicine

## 2024-09-20 DIAGNOSIS — N61 Mastitis without abscess: Secondary | ICD-10-CM | POA: Diagnosis not present

## 2024-09-20 MED ORDER — CLINDAMYCIN HCL 300 MG PO CAPS: 300.0000 mg | ORAL_CAPSULE | Freq: Two times a day (BID) | ORAL | 0 refills | Status: AC

## 2024-09-20 NOTE — Progress Notes (Signed)
 " Virtual Visit Consent   Suzanne Bowman, you are scheduled for a virtual visit with a [facility] provider today. Just as with appointments in the office, your consent must be obtained to participate. Your consent will be active for this visit and any virtual visit you may have with one of our providers in the next 365 days. If you have a MyChart account, a copy of this consent can be sent to you electronically.  As this is a virtual visit, video technology does not allow for your provider to perform a traditional examination. This may limit your provider's ability to fully assess your condition. If your provider identifies any concerns that need to be evaluated in person or the need to arrange testing (such as labs, EKG, etc.), we will make arrangements to do so. Although advances in technology are sophisticated, we cannot ensure that it will always work on either your end or our end. If the connection with a video visit is poor, the visit may have to be switched to a telephone visit. With either a video or telephone visit, we are not always able to ensure that we have a secure connection.  By engaging in this virtual visit, you consent to the provision of healthcare and authorize for your insurance to be billed (if applicable) for the services provided during this visit. Depending on your insurance coverage, you may receive a charge related to this service.  I need to obtain your verbal consent now. Are you willing to proceed with your visit today? Suzanne Bowman has provided verbal consent on 09/20/2024 for a virtual visit (video or telephone). Suzanne Bowman, [place]  Date: 09/20/2024 3:02 PM   Virtual Visit via Video Note   I, Suzanne Bowman, connected with  Suzanne Bowman  ([id number], [id number]) on 09/20/2024 at  3:00 PM EST by a video-enabled telemedicine application and verified that I am speaking with the correct person using two identifiers.  Location: Patient: Virtual Visit Location Patient:  Home Provider: Virtual Visit Location Provider: Home Office   I discussed the limitations of evaluation and management by telemedicine and the availability of in person appointments. The patient expressed understanding and agreed to proceed.    History of Present Illness: Suzanne Bowman is a 48 y.o. who identifies as a female who was assigned female at birth, and is being seen today for c/o a boil on the side of her left nipple. Pt states it is almost like a pimple.  Pt states it is painful. Pt denies discharge from nipple or from area where the bump is located, fever or chills.  Pt states the area is warm to touch. Pt does not recall last mammogram  HPI: HPI  Problems: There are no active problems to display for this patient.   Allergies: Allergies[1] Medications: Current Medications[2]  Observations/Objective: Patient is well-developed, well-nourished in no acute distress.  Resting comfortably  at home.  Head is normocephalic, atraumatic.  No labored breathing.  Speech is clear and coherent with logical content.  Patient is alert and oriented at baseline.    Assessment and Plan: 1. Cellulitis of left breast (Primary) - clindamycin  (CLEOCIN ) 300 MG capsule; Take 1 capsule (300 mg total) by mouth in the morning and at bedtime for 7 days.  Dispense: 14 capsule; Refill: 0  -Pt advised the use of warm compresses as needed -Pt advised if area worsens, increased discharge, pain or fever to follow up in person with urgent care or the emergency room hospital  -Pt verbalized  understanding   Follow Up Instructions: I discussed the assessment and treatment plan with the patient. The patient was provided an opportunity to ask questions and all were answered. The patient agreed with the plan and demonstrated an understanding of the instructions.  A copy of instructions were sent to the patient via MyChart unless otherwise noted below.     The patient was advised to call back or seek an  in-person evaluation if the symptoms worsen or if the condition fails to improve as anticipated.    Suzanne Mater, PA-C     [1]  Allergies Allergen Reactions   Penicillins   [2]  Current Outpatient Medications:    clindamycin  (CLEOCIN ) 300 MG capsule, Take 1 capsule (300 mg total) by mouth in the morning and at bedtime for 7 days., Disp: 14 capsule, Rfl: 0   ibuprofen  (ADVIL ) 800 MG tablet, Take 1 tablet (800 mg total) by mouth every 8 (eight) hours as needed., Disp: 21 tablet, Rfl: 0   oxyCODONE -acetaminophen  (PERCOCET/ROXICET) 5-325 MG tablet, Take 1 tablet by mouth every 6 (six) hours as needed for severe pain (pain score 7-10)., Disp: 10 tablet, Rfl: 0  "

## 2024-09-20 NOTE — Patient Instructions (Signed)
 " Jerel Sprang, thank you for joining Roosvelt Mater, PA-C for today's virtual visit.  While this provider is not your primary care provider (PCP), if your PCP is located in our provider database this encounter information will be shared with them immediately following your visit.   A [facility] MyChart account gives you access to today's visit and all your visits, tests, and labs performed at [facility]  click here if you don't have a [facility] MyChart account or go to [email or web address]  Consent: (Patient) Suzanne Bowman provided verbal consent for this virtual visit at the beginning of the encounter.  Current Medications:  Current Outpatient Medications:    clindamycin  (CLEOCIN ) 300 MG capsule, Take 1 capsule (300 mg total) by mouth in the morning and at bedtime for 7 days., Disp: 14 capsule, Rfl: 0   ibuprofen  (ADVIL ) 800 MG tablet, Take 1 tablet (800 mg total) by mouth every 8 (eight) hours as needed., Disp: 21 tablet, Rfl: 0   oxyCODONE -acetaminophen  (PERCOCET/ROXICET) 5-325 MG tablet, Take 1 tablet by mouth every 6 (six) hours as needed for severe pain (pain score 7-10)., Disp: 10 tablet, Rfl: 0   Medications ordered in this encounter:  Meds ordered this encounter  Medications   clindamycin  (CLEOCIN ) 300 MG capsule    Sig: Take 1 capsule (300 mg total) by mouth in the morning and at bedtime for 7 days.    Dispense:  14 capsule    Refill:  0     *If you need refills on other medications prior to your next appointment, please contact your pharmacy*  Follow-Up: Call back or seek an in-person evaluation if the symptoms worsen or if the condition fails to improve as anticipated.  [facility] [phone]  Other Instructions Mastitis  Mastitis is irritation and swelling (inflammation) in an area of the breast. This most often happens in females who are breastfeeding, but it can happen to anyone. This includes males. It will sometimes go away on its  own. Other times, mastitis may need treatment. What are the causes? In people who are not breastfeeding, mastitis is often caused by germs (bacteria) that get into breast tissue. This can happen through cuts, cracks, or openings in the skin. Other causes include: Nipple piercing. Some types of breast cancer. What are the signs or symptoms? Swelling, redness, tenderness, and pain in the breast. The area may also feel warm. Swelling of the glands under the arm. Fluid coming from the nipple. Feeling tired (fatigue). Headache and body aches. Fever and chills. Vomiting or feeling like you may vomit (nauseous). Symptoms can last 2-5 days. The pain and redness are the worst on days 2 and 3. This will often go away by day 5. If an infection develops and is not treated, pus or fluid may form under the skin (abscess). How is this diagnosed? Mastitis is often diagnosed based on a physical exam and your symptoms. You may also have other tests, such as: Blood tests. Fluid tests. If fluid collects under the skin, it may be tested to find if germs are present. Breast X-rays or ultrasounds. How is this treated? Treatment may include: Using hot or cold compresses. Pain medicine. Antibiotic or steroid medicine. Rest. Drinking plenty of fluids. Removing pus or fluid from under the skin. Follow these instructions at home: Breast care  Keep your nipples clean and dry. If told, put ice on the affected area. Put ice in a plastic bag. Place a towel between your skin and the  bag. Leave the ice on for 20 minutes, 2-3 times a day. If told, put heat on the affected area. Do this as often as told by your doctor. Use the heat source that your doctor recommends, such as a moist heat pack or heating pad. Place a towel between your skin and the heat source. Leave the heat on for 20-30 minutes. If your skin turns bright red, take off the ice or heat right away to prevent skin damage. The risk of damage is  higher if you cannot feel pain, heat, or cold. Medicines Take over-the-counter and prescription medicines only as told by your doctor. If you were prescribed antibiotics, take them as told by your doctor. Do not stop using them even if you start to feel better. Contact a doctor if: You have pus-like fluid leaking from the breast. You have a fever. Your pain and swelling get worse. Your symptoms do not start to get better within 2 days of starting treatment. Your pain is not helped by medicine. Get help right away if: You have a red line going from your breast toward your armpit. This information is not intended to replace advice given to you by your health care provider. Make sure you discuss any questions you have with your health care provider. Document Revised: 07/14/2022 Document Reviewed: 07/14/2022 Elsevier Patient Education  2024 [employer]   If you have been instructed to have an in-person evaluation today at a local Urgent Care facility, please use the link below. It will take you to a list of all of our available [facility], including address, phone number and hours of operation. Please do not delay care.  [facility]  If you or a family member do not have a primary care provider, use the link below to schedule a visit and establish care. When you choose a [facility] primary care physician or advanced practice provider, you gain a long-term partner in health. Find a Primary Care Provider  Learn more about [facility]'s in-office and virtual care options: [facility] - Get Care Now  "
